# Patient Record
Sex: Female | Born: 1980 | Race: White | Hispanic: No | Smoking: Never smoker
Health system: Southern US, Community
[De-identification: ages and names within clinical notes are randomized; demographics above are authoritative.]

## PROBLEM LIST (undated history)

## (undated) ENCOUNTER — Inpatient Hospital Stay (HOSPITAL_COMMUNITY): Payer: Self-pay

## (undated) DIAGNOSIS — J45909 Unspecified asthma, uncomplicated: Secondary | ICD-10-CM

## (undated) DIAGNOSIS — J329 Chronic sinusitis, unspecified: Secondary | ICD-10-CM

## (undated) DIAGNOSIS — R569 Unspecified convulsions: Secondary | ICD-10-CM

## (undated) HISTORY — PX: FOOT SURGERY: SHX648

## (undated) HISTORY — PX: TONSILLECTOMY: SUR1361

---

## 2009-01-31 ENCOUNTER — Encounter: Admission: RE | Admit: 2009-01-31 | Discharge: 2009-01-31 | Payer: Self-pay | Admitting: Specialist

## 2012-03-10 ENCOUNTER — Emergency Department (HOSPITAL_BASED_OUTPATIENT_CLINIC_OR_DEPARTMENT_OTHER)
Admission: EM | Admit: 2012-03-10 | Discharge: 2012-03-10 | Disposition: A | Discharge status: Home / Self Care | Payer: Self-pay | Attending: Emergency Medicine | Admitting: Emergency Medicine

## 2012-03-10 ENCOUNTER — Emergency Department (HOSPITAL_BASED_OUTPATIENT_CLINIC_OR_DEPARTMENT_OTHER): Payer: Self-pay

## 2012-03-10 ENCOUNTER — Encounter (HOSPITAL_BASED_OUTPATIENT_CLINIC_OR_DEPARTMENT_OTHER): Payer: Self-pay | Admitting: *Deleted

## 2012-03-10 DIAGNOSIS — J45909 Unspecified asthma, uncomplicated: Secondary | ICD-10-CM | POA: Insufficient documentation

## 2012-03-10 DIAGNOSIS — R55 Syncope and collapse: Secondary | ICD-10-CM | POA: Insufficient documentation

## 2012-03-10 DIAGNOSIS — Z3202 Encounter for pregnancy test, result negative: Secondary | ICD-10-CM | POA: Insufficient documentation

## 2012-03-10 DIAGNOSIS — G40909 Epilepsy, unspecified, not intractable, without status epilepticus: Secondary | ICD-10-CM | POA: Insufficient documentation

## 2012-03-10 DIAGNOSIS — Y929 Unspecified place or not applicable: Secondary | ICD-10-CM | POA: Insufficient documentation

## 2012-03-10 DIAGNOSIS — T751XXA Unspecified effects of drowning and nonfatal submersion, initial encounter: Secondary | ICD-10-CM

## 2012-03-10 DIAGNOSIS — R569 Unspecified convulsions: Secondary | ICD-10-CM

## 2012-03-10 DIAGNOSIS — Z8709 Personal history of other diseases of the respiratory system: Secondary | ICD-10-CM | POA: Insufficient documentation

## 2012-03-10 DIAGNOSIS — Y939 Activity, unspecified: Secondary | ICD-10-CM | POA: Insufficient documentation

## 2012-03-10 HISTORY — DX: Chronic sinusitis, unspecified: J32.9

## 2012-03-10 HISTORY — DX: Unspecified convulsions: R56.9

## 2012-03-10 HISTORY — DX: Unspecified asthma, uncomplicated: J45.909

## 2012-03-10 LAB — RAPID URINE DRUG SCREEN, HOSP PERFORMED
Amphetamines: POSITIVE — AB
Barbiturates: NOT DETECTED
Benzodiazepines: NOT DETECTED
Cocaine: NOT DETECTED
Opiates: NOT DETECTED
Tetrahydrocannabinol: NOT DETECTED

## 2012-03-10 LAB — URINALYSIS, ROUTINE W REFLEX MICROSCOPIC
Bilirubin Urine: NEGATIVE
Glucose, UA: NEGATIVE mg/dL
Hgb urine dipstick: NEGATIVE
Ketones, ur: NEGATIVE mg/dL
Leukocytes, UA: NEGATIVE
Nitrite: NEGATIVE
Protein, ur: NEGATIVE mg/dL
Specific Gravity, Urine: 1.022 (ref 1.005–1.030)
Urobilinogen, UA: 0.2 mg/dL (ref 0.0–1.0)
pH: 7.5 (ref 5.0–8.0)

## 2012-03-10 LAB — CBC WITH DIFFERENTIAL/PLATELET
Basophils Absolute: 0.1 10*3/uL (ref 0.0–0.1)
Basophils Relative: 1 % (ref 0–1)
Eosinophils Absolute: 0 10*3/uL (ref 0.0–0.7)
Eosinophils Relative: 0 % (ref 0–5)
HCT: 31.4 % — ABNORMAL LOW (ref 36.0–46.0)
Hemoglobin: 10.8 g/dL — ABNORMAL LOW (ref 12.0–15.0)
Lymphocytes Relative: 6 % — ABNORMAL LOW (ref 12–46)
Lymphs Abs: 0.4 10*3/uL — ABNORMAL LOW (ref 0.7–4.0)
MCH: 30.6 pg (ref 26.0–34.0)
MCHC: 34.4 g/dL (ref 30.0–36.0)
MCV: 89 fL (ref 78.0–100.0)
Monocytes Absolute: 0.3 10*3/uL (ref 0.1–1.0)
Monocytes Relative: 3 % (ref 3–12)
Neutro Abs: 6.8 10*3/uL (ref 1.7–7.7)
Neutrophils Relative %: 90 % — ABNORMAL HIGH (ref 43–77)
Platelets: 184 10*3/uL (ref 150–400)
RBC: 3.53 MIL/uL — ABNORMAL LOW (ref 3.87–5.11)
RDW: 12.7 % (ref 11.5–15.5)
WBC: 7.5 10*3/uL (ref 4.0–10.5)

## 2012-03-10 LAB — BASIC METABOLIC PANEL
BUN: 13 mg/dL (ref 6–23)
CO2: 25 mEq/L (ref 19–32)
Calcium: 9.3 mg/dL (ref 8.4–10.5)
Chloride: 99 mEq/L (ref 96–112)
Creatinine, Ser: 1 mg/dL (ref 0.50–1.10)
GFR calc Af Amer: 86 mL/min — ABNORMAL LOW (ref >90)
GFR calc non Af Amer: 74 mL/min — ABNORMAL LOW (ref >90)
Glucose, Bld: 117 mg/dL — ABNORMAL HIGH (ref 70–99)
Potassium: 3.6 mEq/L (ref 3.5–5.1)
Sodium: 135 mEq/L (ref 135–145)

## 2012-03-10 LAB — PREGNANCY, URINE: Preg Test, Ur: NEGATIVE
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
## 2012-03-10 NOTE — ED Notes (Signed)
Pt. Is in no distress.  Pt. Mother at bedside.
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
## 2012-03-25 DIAGNOSIS — 419620001 Death: Secondary | SNOMED CT | POA: Insufficient documentation

## 2012-03-25 DEATH — deceased

## 2014-12-02 ENCOUNTER — Inpatient Hospital Stay (HOSPITAL_COMMUNITY)
Admission: AD | Admit: 2014-12-02 | Discharge: 2014-12-02 | Disposition: A | Discharge status: Home / Self Care | Payer: Medicaid Other | Source: Ambulatory Visit | Attending: Family Medicine | Admitting: Family Medicine

## 2014-12-02 ENCOUNTER — Encounter (HOSPITAL_COMMUNITY): Payer: Self-pay | Admitting: *Deleted

## 2014-12-02 DIAGNOSIS — Z3A30 30 weeks gestation of pregnancy: Secondary | ICD-10-CM | POA: Diagnosis not present

## 2014-12-02 DIAGNOSIS — O368130 Decreased fetal movements, third trimester, not applicable or unspecified: Secondary | ICD-10-CM | POA: Diagnosis present

## 2014-12-02 NOTE — Discharge Instructions (Signed)
Fetal Movement Counts  Patient Name: __________________________________________________ Patient Due Date: ____________________  Performing a fetal movement count is highly recommended in high-risk pregnancies, but it is good for every pregnant woman to do. Your health care provider may ask you to start counting fetal movements at 28 weeks of the pregnancy. Fetal movements often increase:  · After eating a full meal.  · After physical activity.  · After eating or drinking something sweet or cold.  · At rest.  Pay attention to when you feel the baby is most active. This will help you notice a pattern of your baby's sleep and wake cycles and what factors contribute to an increase in fetal movement. It is important to perform a fetal movement count at the same time each day when your baby is normally most active.   HOW TO COUNT FETAL MOVEMENTS  1. Find a quiet and comfortable area to sit or lie down on your left side. Lying on your left side provides the best blood and oxygen circulation to your baby.  2. Write down the day and time on a sheet of paper or in a journal.  3. Start counting kicks, flutters, swishes, rolls, or jabs in a 2-hour period. You should feel at least 10 movements within 2 hours.  4. If you do not feel 10 movements in 2 hours, wait 2-3 hours and count again. Look for a change in the pattern or not enough counts in 2 hours.  SEEK MEDICAL CARE IF:  · You feel less than 10 counts in 2 hours, tried twice.  · There is no movement in over an hour.  · The pattern is changing or taking longer each day to reach 10 counts in 2 hours.  · You feel the baby is not moving as he or she usually does.  Date: ____________ Movements: ____________ Start time: ____________ Finish time: ____________   Date: ____________ Movements: ____________ Start time: ____________ Finish time: ____________  Date: ____________ Movements: ____________ Start time: ____________ Finish time: ____________  Date: ____________ Movements:  ____________ Start time: ____________ Finish time: ____________  Date: ____________ Movements: ____________ Start time: ____________ Finish time: ____________  Date: ____________ Movements: ____________ Start time: ____________ Finish time: ____________  Date: ____________ Movements: ____________ Start time: ____________ Finish time: ____________  Date: ____________ Movements: ____________ Start time: ____________ Finish time: ____________   Date: ____________ Movements: ____________ Start time: ____________ Finish time: ____________  Date: ____________ Movements: ____________ Start time: ____________ Finish time: ____________  Date: ____________ Movements: ____________ Start time: ____________ Finish time: ____________  Date: ____________ Movements: ____________ Start time: ____________ Finish time: ____________  Date: ____________ Movements: ____________ Start time: ____________ Finish time: ____________  Date: ____________ Movements: ____________ Start time: ____________ Finish time: ____________  Date: ____________ Movements: ____________ Start time: ____________ Finish time: ____________   Date: ____________ Movements: ____________ Start time: ____________ Finish time: ____________  Date: ____________ Movements: ____________ Start time: ____________ Finish time: ____________  Date: ____________ Movements: ____________ Start time: ____________ Finish time: ____________  Date: ____________ Movements: ____________ Start time: ____________ Finish time: ____________  Date: ____________ Movements: ____________ Start time: ____________ Finish time: ____________  Date: ____________ Movements: ____________ Start time: ____________ Finish time: ____________  Date: ____________ Movements: ____________ Start time: ____________ Finish time: ____________   Date: ____________ Movements: ____________ Start time: ____________ Finish time: ____________  Date: ____________ Movements: ____________ Start time: ____________ Finish  time: ____________  Date: ____________ Movements: ____________ Start time: ____________ Finish time: ____________  Date: ____________ Movements: ____________ Start time:   ____________ Finish time: ____________  Date: ____________ Movements: ____________ Start time: ____________ Finish time: ____________  Date: ____________ Movements: ____________ Start time: ____________ Finish time: ____________  Date: ____________ Movements: ____________ Start time: ____________ Finish time: ____________   Date: ____________ Movements: ____________ Start time: ____________ Finish time: ____________  Date: ____________ Movements: ____________ Start time: ____________ Finish time: ____________  Date: ____________ Movements: ____________ Start time: ____________ Finish time: ____________  Date: ____________ Movements: ____________ Start time: ____________ Finish time: ____________  Date: ____________ Movements: ____________ Start time: ____________ Finish time: ____________  Date: ____________ Movements: ____________ Start time: ____________ Finish time: ____________  Date: ____________ Movements: ____________ Start time: ____________ Finish time: ____________   Date: ____________ Movements: ____________ Start time: ____________ Finish time: ____________  Date: ____________ Movements: ____________ Start time: ____________ Finish time: ____________  Date: ____________ Movements: ____________ Start time: ____________ Finish time: ____________  Date: ____________ Movements: ____________ Start time: ____________ Finish time: ____________  Date: ____________ Movements: ____________ Start time: ____________ Finish time: ____________  Date: ____________ Movements: ____________ Start time: ____________ Finish time: ____________  Date: ____________ Movements: ____________ Start time: ____________ Finish time: ____________   Date: ____________ Movements: ____________ Start time: ____________ Finish time: ____________  Date: ____________  Movements: ____________ Start time: ____________ Finish time: ____________  Date: ____________ Movements: ____________ Start time: ____________ Finish time: ____________  Date: ____________ Movements: ____________ Start time: ____________ Finish time: ____________  Date: ____________ Movements: ____________ Start time: ____________ Finish time: ____________  Date: ____________ Movements: ____________ Start time: ____________ Finish time: ____________  Date: ____________ Movements: ____________ Start time: ____________ Finish time: ____________   Date: ____________ Movements: ____________ Start time: ____________ Finish time: ____________  Date: ____________ Movements: ____________ Start time: ____________ Finish time: ____________  Date: ____________ Movements: ____________ Start time: ____________ Finish time: ____________  Date: ____________ Movements: ____________ Start time: ____________ Finish time: ____________  Date: ____________ Movements: ____________ Start time: ____________ Finish time: ____________  Date: ____________ Movements: ____________ Start time: ____________ Finish time: ____________     This information is not intended to replace advice given to you by your health care provider. Make sure you discuss any questions you have with your health care provider.     Document Released: 02/10/2006 Document Revised: 02/01/2014 Document Reviewed: 11/08/2011  [employer]vier Interactive Patient Education ©2016 [employer]

## 2014-12-02 NOTE — MAU Note (Signed)
Decreased fetal movement today.

## 2015-10-07 ENCOUNTER — Encounter (HOSPITAL_COMMUNITY): Payer: Self-pay

## 2020-01-23 ENCOUNTER — Emergency Department (HOSPITAL_BASED_OUTPATIENT_CLINIC_OR_DEPARTMENT_OTHER): Payer: Medicaid Other

## 2020-01-23 ENCOUNTER — Encounter (HOSPITAL_BASED_OUTPATIENT_CLINIC_OR_DEPARTMENT_OTHER): Payer: Self-pay | Admitting: *Deleted

## 2020-01-23 ENCOUNTER — Emergency Department (HOSPITAL_BASED_OUTPATIENT_CLINIC_OR_DEPARTMENT_OTHER)
Admission: EM | Admit: 2020-01-23 | Discharge: 2020-01-23 | Disposition: A | Discharge status: Home / Self Care | Payer: Medicaid Other | Attending: Emergency Medicine | Admitting: Emergency Medicine

## 2020-01-23 ENCOUNTER — Other Ambulatory Visit: Payer: Self-pay

## 2020-01-23 DIAGNOSIS — R110 Nausea: Secondary | ICD-10-CM | POA: Diagnosis not present

## 2020-01-23 DIAGNOSIS — S0990XA Unspecified injury of head, initial encounter: Secondary | ICD-10-CM | POA: Diagnosis present

## 2020-01-23 DIAGNOSIS — X58XXXA Exposure to other specified factors, initial encounter: Secondary | ICD-10-CM | POA: Diagnosis not present

## 2020-01-23 DIAGNOSIS — Z5321 Procedure and treatment not carried out due to patient leaving prior to being seen by health care provider: Secondary | ICD-10-CM | POA: Insufficient documentation

## 2020-01-23 DIAGNOSIS — R42 Dizziness and giddiness: Secondary | ICD-10-CM | POA: Insufficient documentation
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
## 2020-01-23 NOTE — ED Notes (Signed)
No answer for vital sign recheck. 

## 2022-05-16 IMAGING — CT CT HEAD W/O CM
3 series · 15 of 47 positions shown, 18 images · non-contrast
Comparison: None.

CLINICAL DATA: Recent head injury with nausea and dizziness

EXAM:
CT HEAD WITHOUT CONTRAST
TECHNIQUE: Contiguous axial images were obtained from the base of the skull
through the vertex without intravenous contrast.

[Series 2: head wo · axial · 0.44mm/px · z∈[-234,-98]mm · 9 of 33 slices shown, 12 images]
[im 3/33  brain]
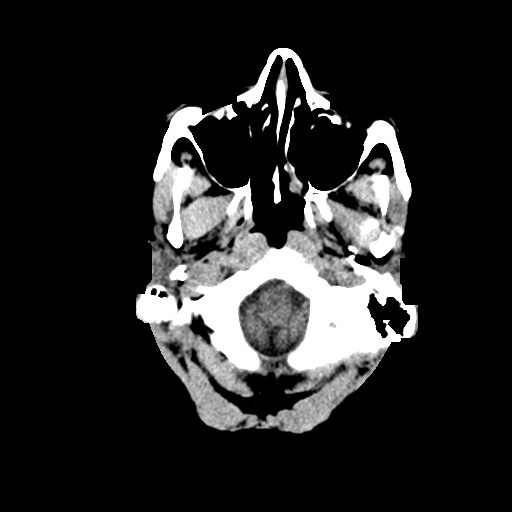
[im 3/33  bone]
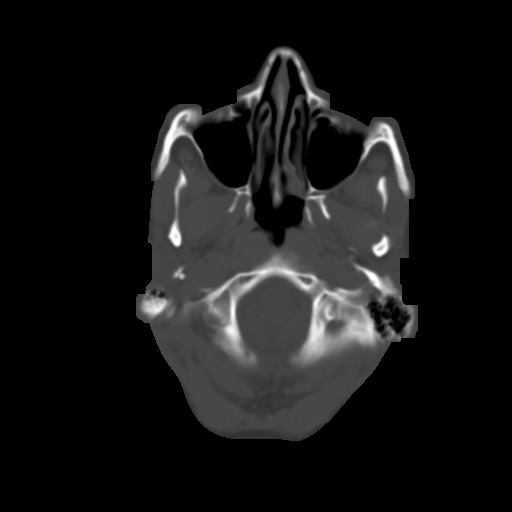
[im 6/33  brain]
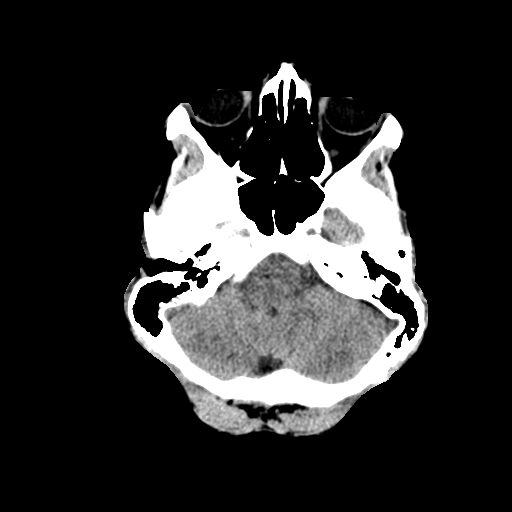
[im 9/33  brain]
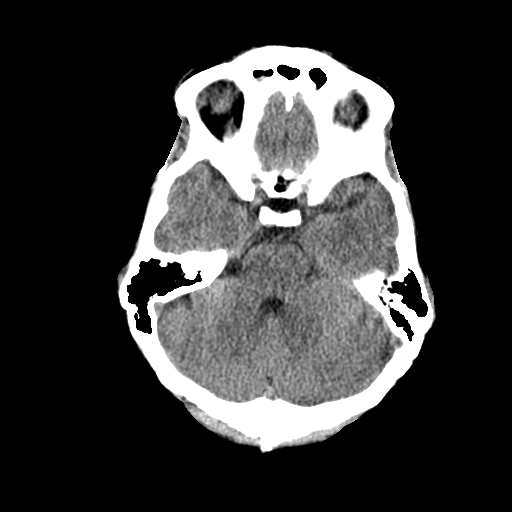
[im 13/33  brain]
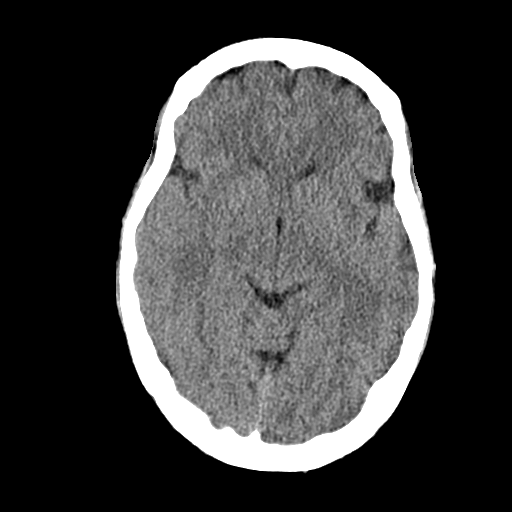
[im 17/33  brain]
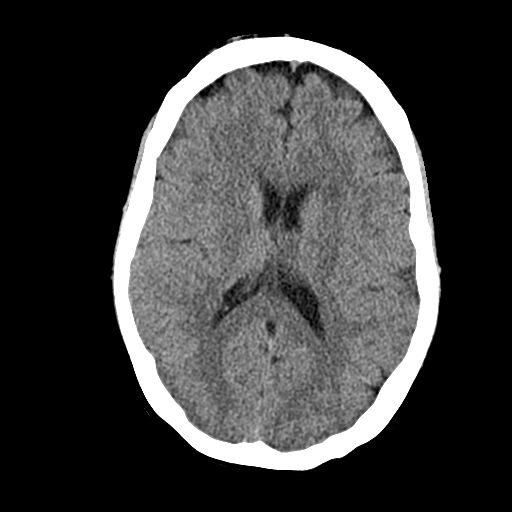
[im 17/33  bone]
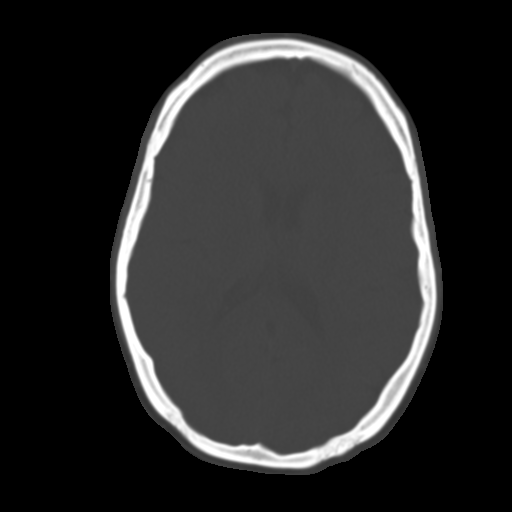
[im 20/33  brain]
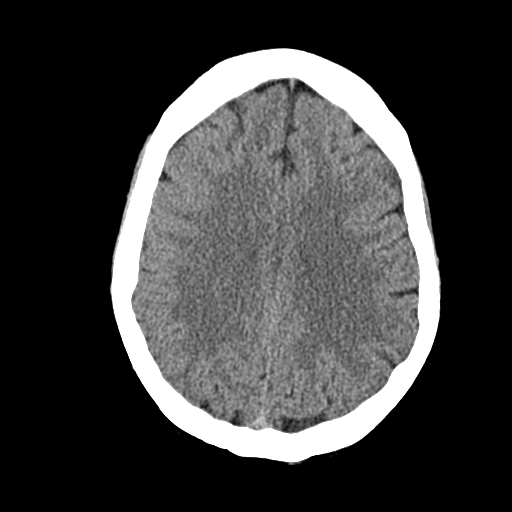
[im 24/33  brain]
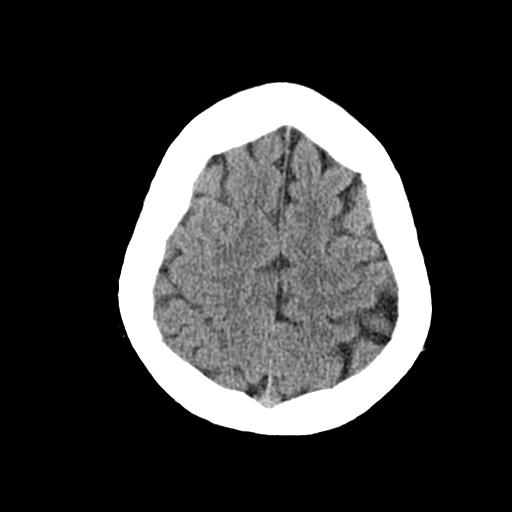
[im 27/33  brain]
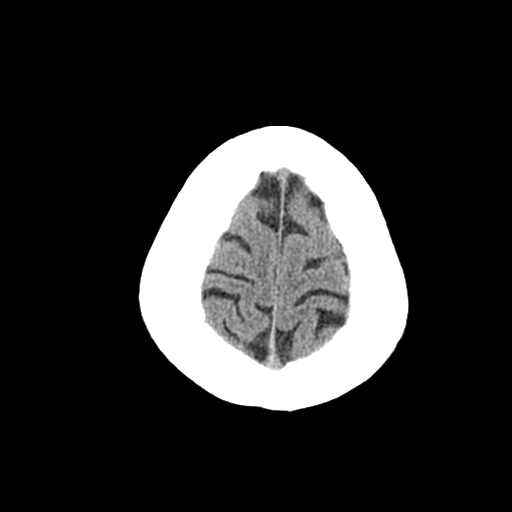
[im 30/33  brain]
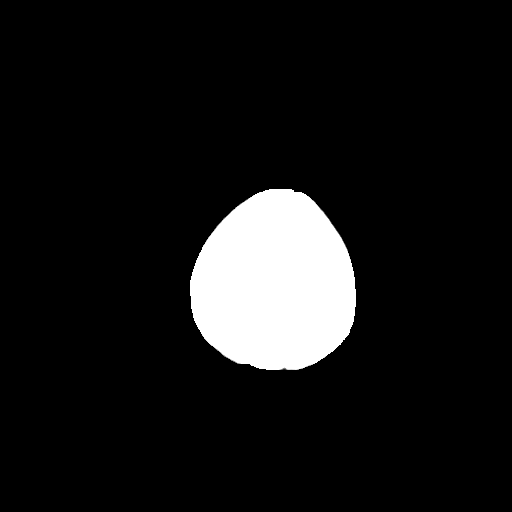
[im 30/33  bone]
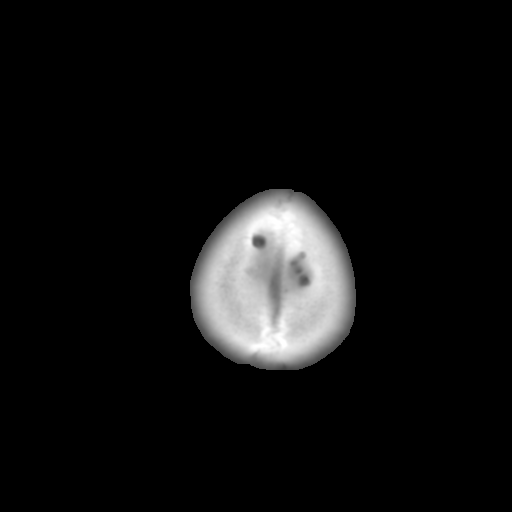

[Series 4: cor soft · coronal · 0.33mm/px · 3 of 70 slices shown]
[im 24/70  brain]
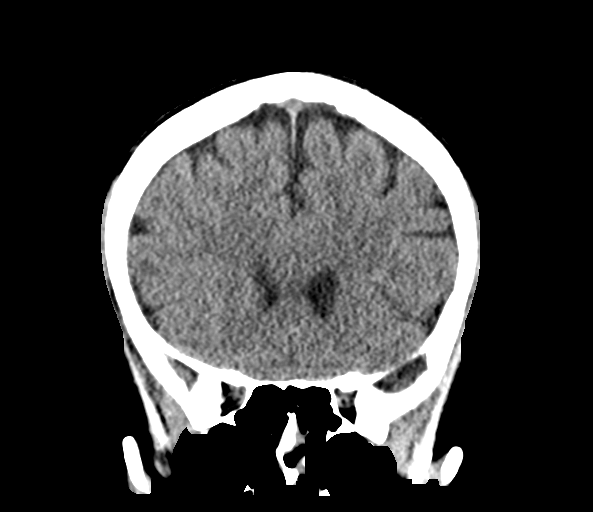
[im 31/70  brain]
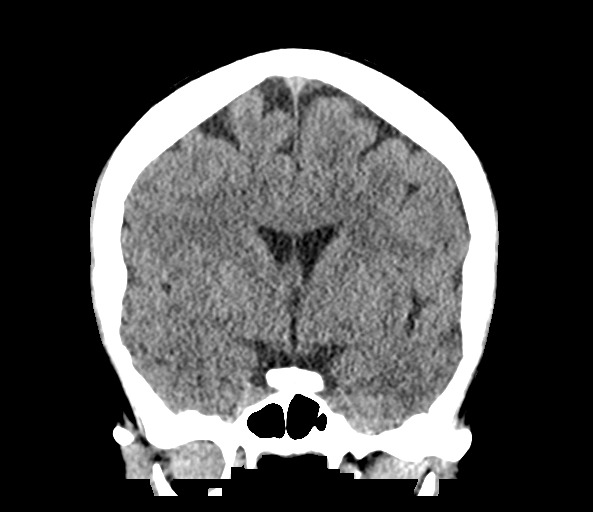
[im 39/70  brain]
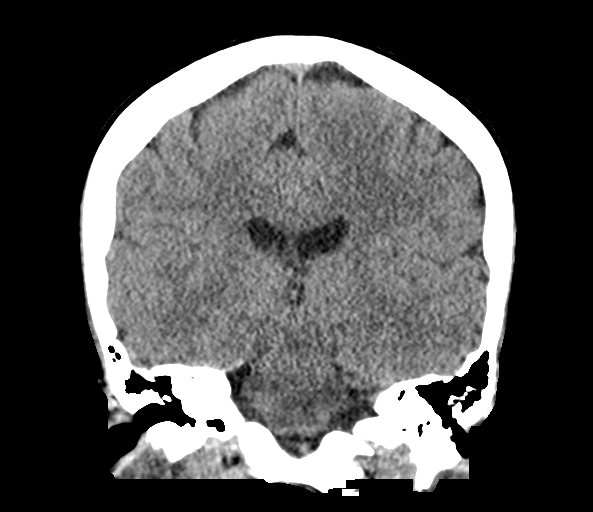

[Series 5: sag soft · sagittal · 0.31mm/px · 3 of 54 slices shown]
[im 18/54  brain]
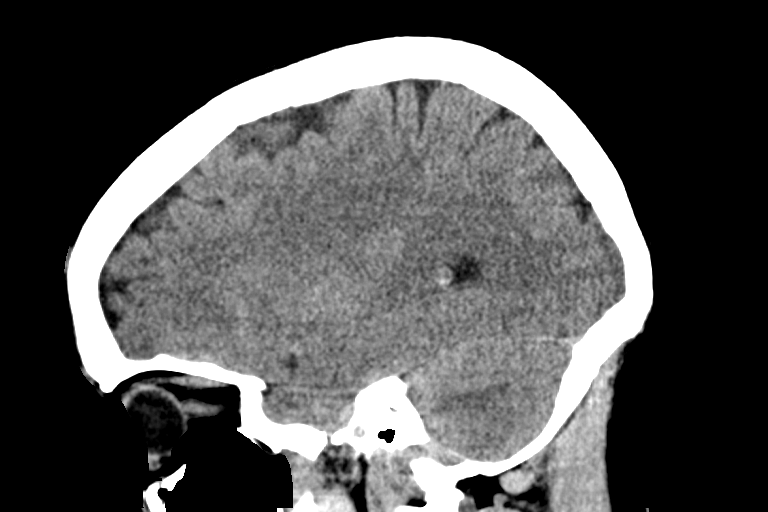
[im 27/54  brain]
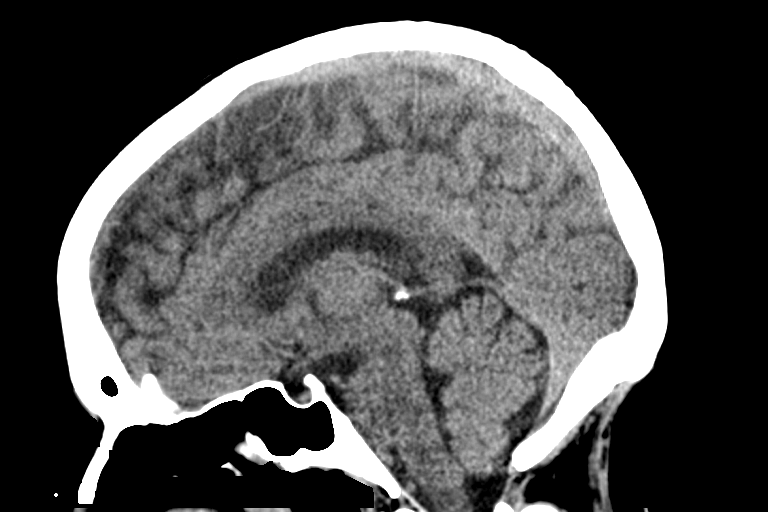
[im 36/54  brain]
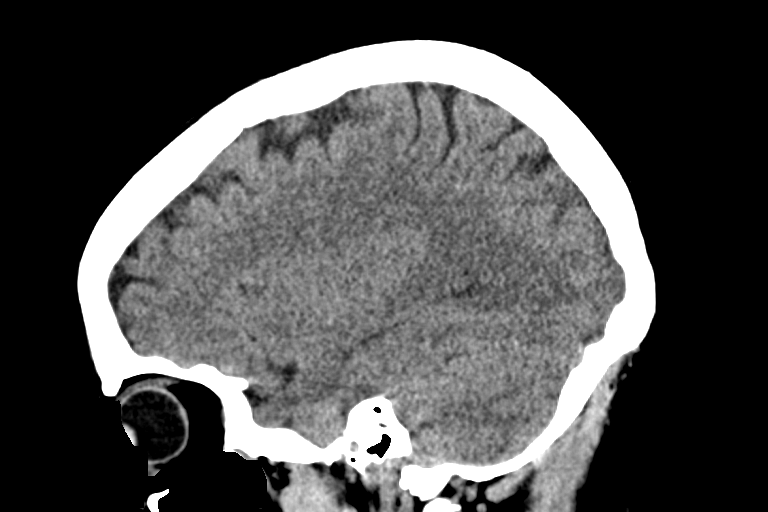

[15 of 47 positions shown; findings below may reference images not displayed]

FINDINGS: Brain: There is no acute intracranial hemorrhage, mass effect, or
edema. Gray-white differentiation is preserved. There is no
extra-axial fluid collection. Ventricles and sulci are within normal
limits in size and configuration.

Vascular: No hyperdense vessel or unexpected calcification.

Skull: Calvarium is unremarkable.

Sinuses/Orbits: No significant paranasal sinus opacification. Orbits
are unremarkable.

Other: None.
IMPRESSION: No evidence of acute intracranial injury.
